# Patient Record
Sex: Female | Born: 1960 | Race: Black or African American | Hispanic: No | Marital: Single | Smoking: Current every day smoker
Health system: Southern US, Community
[De-identification: ages and names within clinical notes are randomized; demographics above are authoritative.]

## PROBLEM LIST (undated history)

## (undated) DIAGNOSIS — K7460 Unspecified cirrhosis of liver: Secondary | ICD-10-CM

## (undated) DIAGNOSIS — E119 Type 2 diabetes mellitus without complications: Secondary | ICD-10-CM

## (undated) DIAGNOSIS — J45909 Unspecified asthma, uncomplicated: Secondary | ICD-10-CM

## (undated) DIAGNOSIS — R569 Unspecified convulsions: Secondary | ICD-10-CM

## (undated) DIAGNOSIS — I10 Essential (primary) hypertension: Secondary | ICD-10-CM

## (undated) DIAGNOSIS — E7800 Pure hypercholesterolemia, unspecified: Secondary | ICD-10-CM

## (undated) HISTORY — PX: TUBAL LIGATION: SHX77

## (undated) HISTORY — PX: HERNIA REPAIR: SHX51

---

## 2017-09-03 ENCOUNTER — Emergency Department (HOSPITAL_BASED_OUTPATIENT_CLINIC_OR_DEPARTMENT_OTHER): Payer: Medicaid Other

## 2017-09-03 ENCOUNTER — Other Ambulatory Visit: Payer: Self-pay

## 2017-09-03 ENCOUNTER — Emergency Department (HOSPITAL_BASED_OUTPATIENT_CLINIC_OR_DEPARTMENT_OTHER)
Admission: EM | Admit: 2017-09-03 | Discharge: 2017-09-03 | Disposition: A | Discharge status: Home / Self Care | Payer: Medicaid Other | Attending: Emergency Medicine | Admitting: Emergency Medicine

## 2017-09-03 ENCOUNTER — Encounter (HOSPITAL_BASED_OUTPATIENT_CLINIC_OR_DEPARTMENT_OTHER): Payer: Self-pay | Admitting: *Deleted

## 2017-09-03 DIAGNOSIS — R1032 Left lower quadrant pain: Secondary | ICD-10-CM

## 2017-09-03 DIAGNOSIS — R197 Diarrhea, unspecified: Secondary | ICD-10-CM | POA: Insufficient documentation

## 2017-09-03 DIAGNOSIS — F17210 Nicotine dependence, cigarettes, uncomplicated: Secondary | ICD-10-CM | POA: Diagnosis not present

## 2017-09-03 HISTORY — DX: Pure hypercholesterolemia, unspecified: E78.00

## 2017-09-03 HISTORY — DX: Type 2 diabetes mellitus without complications: E11.9

## 2017-09-03 HISTORY — DX: Unspecified cirrhosis of liver: K74.60

## 2017-09-03 HISTORY — DX: Unspecified asthma, uncomplicated: J45.909

## 2017-09-03 HISTORY — DX: Essential (primary) hypertension: I10

## 2017-09-03 HISTORY — DX: Unspecified convulsions: R56.9

## 2017-09-03 LAB — COMPREHENSIVE METABOLIC PANEL
ALBUMIN: 4.4 g/dL (ref 3.5–5.0)
ALT: 61 U/L — AB (ref 14–54)
AST: 103 U/L — AB (ref 15–41)
Alkaline Phosphatase: 61 U/L (ref 38–126)
Anion gap: 10 (ref 5–15)
BUN: 11 mg/dL (ref 6–20)
CHLORIDE: 104 mmol/L (ref 101–111)
CO2: 25 mmol/L (ref 22–32)
CREATININE: 0.7 mg/dL (ref 0.44–1.00)
Calcium: 9.5 mg/dL (ref 8.9–10.3)
GFR calc Af Amer: >60 mL/min (ref >60)
GLUCOSE: 97 mg/dL (ref 65–99)
Potassium: 3.9 mmol/L (ref 3.5–5.1)
Sodium: 139 mmol/L (ref 135–145)
Total Bilirubin: 0.6 mg/dL (ref 0.3–1.2)
Total Protein: 8.3 g/dL — ABNORMAL HIGH (ref 6.5–8.1)

## 2017-09-03 LAB — URINALYSIS, MICROSCOPIC (REFLEX)

## 2017-09-03 LAB — URINALYSIS, ROUTINE W REFLEX MICROSCOPIC
Bilirubin Urine: NEGATIVE
GLUCOSE, UA: NEGATIVE mg/dL
KETONES UR: NEGATIVE mg/dL
Nitrite: NEGATIVE
PH: 6 (ref 5.0–8.0)
PROTEIN: NEGATIVE mg/dL
Specific Gravity, Urine: 1.015 (ref 1.005–1.030)

## 2017-09-03 LAB — LIPASE, BLOOD: LIPASE: 43 U/L (ref 11–51)

## 2017-09-03 LAB — CBC
HEMATOCRIT: 37.8 % (ref 36.0–46.0)
Hemoglobin: 13 g/dL (ref 12.0–15.0)
MCH: 30.1 pg (ref 26.0–34.0)
MCHC: 34.4 g/dL (ref 30.0–36.0)
MCV: 87.5 fL (ref 78.0–100.0)
PLATELETS: 171 10*3/uL (ref 150–400)
RBC: 4.32 MIL/uL (ref 3.87–5.11)
RDW: 14.2 % (ref 11.5–15.5)
WBC: 3.2 10*3/uL — ABNORMAL LOW (ref 4.0–10.5)

## 2017-09-03 LAB — CBG MONITORING, ED: Glucose-Capillary: 95 mg/dL (ref 65–99)

## 2017-09-03 MED ORDER — DIPHENHYDRAMINE HCL 50 MG/ML IJ SOLN
INTRAMUSCULAR | Status: AC
  Filled 2017-09-03: qty 1

## 2017-09-03 MED ORDER — SODIUM CHLORIDE 0.9 % IV SOLN
40.0000 mg | Freq: Once | INTRAVENOUS | Status: DC
  Filled 2017-09-03: qty 4

## 2017-09-03 MED ORDER — ONDANSETRON HCL 4 MG/2ML IJ SOLN
4.0000 mg | Freq: Once | INTRAMUSCULAR | Status: AC
  Administered 2017-09-03: 4 mg via INTRAVENOUS
  Filled 2017-09-03: qty 2

## 2017-09-03 MED ORDER — LOPERAMIDE HCL 2 MG PO CAPS: 2.0000 mg | ORAL_CAPSULE | Freq: Four times a day (QID) | ORAL | 0 refills | Status: AC | PRN

## 2017-09-03 MED ORDER — PREDNISONE 20 MG PO TABS: 20.0000 mg | ORAL_TABLET | Freq: Every day | ORAL | 0 refills | Status: AC

## 2017-09-03 MED ORDER — METHOCARBAMOL 500 MG PO TABS: 500.0000 mg | ORAL_TABLET | Freq: Three times a day (TID) | ORAL | 0 refills | Status: DC | PRN

## 2017-09-03 MED ORDER — IBUPROFEN 400 MG PO TABS
600.0000 mg | ORAL_TABLET | Freq: Once | ORAL | Status: AC
  Administered 2017-09-03: 600 mg via ORAL
  Filled 2017-09-03: qty 1

## 2017-09-03 MED ORDER — FAMOTIDINE IN NACL 20-0.9 MG/50ML-% IV SOLN
INTRAVENOUS | Status: AC
  Administered 2017-09-03: 40 mg
  Filled 2017-09-03: qty 100

## 2017-09-03 MED ORDER — DICYCLOMINE HCL 20 MG PO TABS: 20.0000 mg | ORAL_TABLET | Freq: Two times a day (BID) | ORAL | 0 refills | Status: AC

## 2017-09-03 MED ORDER — SODIUM CHLORIDE 0.9 % IV BOLUS (SEPSIS)
1000.0000 mL | Freq: Once | INTRAVENOUS | Status: AC
  Administered 2017-09-03: 1000 mL via INTRAVENOUS

## 2017-09-03 MED ORDER — METHYLPREDNISOLONE SODIUM SUCC 125 MG IJ SOLR
125.0000 mg | Freq: Once | INTRAMUSCULAR | Status: AC
  Administered 2017-09-03: 125 mg via INTRAVENOUS
  Filled 2017-09-03: qty 2

## 2017-09-03 MED ORDER — IOPAMIDOL (ISOVUE-300) INJECTION 61%
100.0000 mL | Freq: Once | INTRAVENOUS | Status: AC | PRN
  Administered 2017-09-03: 100 mL via INTRAVENOUS

## 2017-09-03 MED ORDER — ONDANSETRON 4 MG PO TBDP: 4.0000 mg | ORAL_TABLET | Freq: Three times a day (TID) | ORAL | 0 refills | Status: AC | PRN

## 2017-09-03 MED ORDER — DIPHENHYDRAMINE HCL 50 MG/ML IJ SOLN
50.0000 mg | Freq: Once | INTRAMUSCULAR | Status: AC
  Administered 2017-09-03: 50 mg via INTRAVENOUS

## 2017-09-03 MED ORDER — MORPHINE SULFATE (PF) 4 MG/ML IV SOLN
4.0000 mg | Freq: Once | INTRAVENOUS | Status: AC
  Administered 2017-09-03: 4 mg via INTRAVENOUS
  Filled 2017-09-03: qty 1

## 2017-09-03 MED ORDER — IBUPROFEN 800 MG PO TABS: 800.0000 mg | ORAL_TABLET | Freq: Three times a day (TID) | ORAL | 0 refills | Status: DC | PRN

## 2017-09-03 NOTE — ED Notes (Signed)
ED Provider at bedside. 

## 2017-09-03 NOTE — ED Notes (Signed)
MD made aware that pt is reporting itching again -- denies chest pain/sob. Plan of care: administer Pepcid.

## 2017-09-03 NOTE — ED Notes (Signed)
Pt reports she began itching while in CT after receiving IV dye -- reports itching to head and chest. Denies sob, chest pain. MD at bedside for eval.

## 2017-09-03 NOTE — ED Notes (Signed)
Pt able to tolerate Sprite and graham crackers with no n/v.

## 2017-09-03 NOTE — ED Notes (Signed)
Patient transported to CT 

## 2017-09-03 NOTE — ED Notes (Signed)
Pt drinking PO contrast for CT scan. CT will wait on labs to result prior to imaging with iv contrast, per protocol, pt with hx DM

## 2017-09-03 NOTE — Discharge Instructions (Addendum)
We believe your symptoms are caused by either a viral infection or possible a bad food exposure.  Either way, since your symptoms have improved, we feel it is safe for you to go home and follow up with your regular doctor.  Please read the included information and stick to a bland diet for the next two days.  Drink plenty of clear fluids, and if you were provided with a prescription, please take it according to the label instructions.    If you develop any new or worsening symptoms, including persistent vomiting not controlled with medication, fever greater than 101, severe or worsening abdominal pain, or other symptoms that concern you, please return immediately to the Emergency Department.  You had a reaction to the contrast dye in the CT scanner. Take the steroid as directed and Benadryl as needed for itching.   Viral Gastroenteritis  Viral gastroenteritis is also known as stomach flu. This condition affects the stomach and intestinal tract. It can cause sudden diarrhea and vomiting. The illness typically lasts 3 to 8 days. Most people develop an immune response that eventually gets rid of the virus. While this natural response develops, the virus can make you quite ill.  CAUSES  Many different viruses can cause gastroenteritis, such as rotavirus or noroviruses. You can catch one of these viruses by consuming contaminated food or water. You may also catch a virus by sharing utensils or other personal items with an infected person or by touching a contaminated surface.  SYMPTOMS  The most common symptoms are diarrhea and vomiting. These problems can cause a severe loss of body fluids (dehydration) and a body salt (electrolyte) imbalance. Other symptoms may include:  Fever.  Headache.  Fatigue.  Abdominal pain. DIAGNOSIS  Your caregiver can usually diagnose viral gastroenteritis based on your symptoms and a physical exam. A stool sample may also be taken to test for the presence of viruses or other  infections.  TREATMENT  This illness typically goes away on its own. Treatments are aimed at rehydration. The most serious cases of viral gastroenteritis involve vomiting so severely that you are not able to keep fluids down. In these cases, fluids must be given through an intravenous line (IV).  HOME CARE INSTRUCTIONS  Drink enough fluids to keep your urine clear or pale yellow. Drink small amounts of fluids frequently and increase the amounts as tolerated.  Ask your caregiver for specific rehydration instructions.  Avoid:  Foods high in sugar.  Alcohol.  Carbonated drinks.  Tobacco.  Juice.  Caffeine drinks.  Extremely hot or cold fluids.  Fatty, greasy foods.  Too much intake of anything at one time.  Dairy products until 24 to 48 hours after diarrhea stops. You may consume probiotics. Probiotics are active cultures of beneficial bacteria. They may lessen the amount and number of diarrheal stools in adults. Probiotics can be found in yogurt with active cultures and in supplements.  Wash your hands well to avoid spreading the virus.  Only take over-the-counter or prescription medicines for pain, discomfort, or fever as directed by your caregiver. Do not give aspirin to children. Antidiarrheal medicines are not recommended.  Ask your caregiver if you should continue to take your regular prescribed and over-the-counter medicines.  Keep all follow-up appointments as directed by your caregiver. SEEK IMMEDIATE MEDICAL CARE IF:  You are unable to keep fluids down.  You do not urinate at least once every 6 to 8 hours.  You develop shortness of breath.  You notice blood  in your stool or vomit. This may look like coffee grounds.  You have abdominal pain that increases or is concentrated in one small area (localized).  You have persistent vomiting or diarrhea.  You have a fever.  The patient is a child younger than 3 months, and he or she has a fever.  The patient is a child older than 3  months, and he or she has a fever and persistent symptoms.  The patient is a child older than 3 months, and he or she has a fever and symptoms suddenly get worse.  The patient is a baby, and he or she has no tears when crying. MAKE SURE YOU:  Understand these instructions.  Will watch your condition.  Will get help right away if you are not doing well or get worse. This information is not intended to replace advice given to you by your health care provider. Make sure you discuss any questions you have with your health care provider.  Document Released: 08/22/2005 Document Revised: 11/14/2011 Document Reviewed: 06/08/2011  Elsevier Interactive Patient Education [employer]2016 [employer] Inc[employer]

## 2017-09-03 NOTE — ED Notes (Signed)
Pt educated about not driving or performing other critical tasks (such as operating heavy machinery, caring for infant/toddler/child) due to sedative nature of medications received in ED. Also warned about risks of consuming alcohol or taking other medications with sedative properties. Pt/caregiver verbalized understanding.  

## 2017-09-03 NOTE — ED Triage Notes (Signed)
Pt presents with LLQ pain x3days. Denies known fever, n/v. Reports mild diarrhea x3days. States she's been out of all of her medications since moving here approx 6 months ago.

## 2017-09-03 NOTE — ED Provider Notes (Signed)
Emergency Department Provider Note   I have reviewed the triage vital signs and the nursing notes.   HISTORY  Chief Complaint Abdominal Pain   HPI Lori Duke is a 56 y.o. female presents to the emergency department for evaluation of left lower abdominal pain. Pain has been worsening over the last 3 days.  She denies any nausea or vomiting but has had some mild, nonbloody diarrhea..  No dysuria, hesitancy, urgency.  No fevers or chills.  She has been taking over-the-counter medication with no significant relief in symptoms.  Patient reports that she is been out of all of her prescription medications for the last 6 months since moving here and does not have a PCP. Denies any history of diverticulitis or colitis. Has had surgery for hernia repair in the past.    Past Medical History:  Diagnosis Date  . Asthma   . Cirrhosis (HCC)   . Diabetes mellitus without complication (HCC)   . Hypercholesteremia   . Hypertension   . Seizures (HCC)     There are no active problems to display for this patient.   Past Surgical History:  Procedure Laterality Date  . HERNIA REPAIR     x4  . TUBAL LIGATION      Current Outpatient Rx  . Order #: [id number] Class: Historical Med  . Order #: [id number] Class: Print  . Order #: [id number] Class: Print  . Order #: [id number] Class: Print  . Order #: [id number] Class: Print    Allergies Isovue [iopamidol]  No family history on file.  Social History Social History   Tobacco Use  . Smoking status: Current Every Day Smoker  . Smokeless tobacco: Never Used  Substance Use Topics  . Alcohol use: Yes    Comment: daily  . Drug use: No    Review of Systems  Constitutional: No fever/chills Eyes: No visual changes. ENT: No sore throat. Cardiovascular: Denies chest pain. Respiratory: Denies shortness of breath. Gastrointestinal: Positive LLQ abdominal pain.  No nausea, no vomiting. Positive diarrhea.  No constipation. Genitourinary:  Negative for dysuria. Musculoskeletal: Negative for back pain. Skin: Negative for rash. Neurological: Negative for headaches, focal weakness or numbness.  10-point ROS otherwise negative.  ____________________________________________   PHYSICAL EXAM:  VITAL SIGNS: ED Triage Vitals  Enc Vitals Group     BP 09/03/17 0848 (!) 124/92     Pulse Rate 09/03/17 0848 86     Resp 09/03/17 0848 16     Temp 09/03/17 0848 98.3 F (36.8 C)     Temp Source 09/03/17 0848 Oral     SpO2 09/03/17 0848 98 %     Weight 09/03/17 0848 110 lb (49.9 kg)     Height 09/03/17 0848 5\' 4"  (1.626 m)     Pain Score 09/03/17 0847 5   Constitutional: Alert and oriented. Well appearing and in no acute distress. Eyes: Conjunctivae are normal. Head: Atraumatic. Nose: No congestion/rhinnorhea. Mouth/Throat: Mucous membranes are moist.  Oropharynx non-erythematous. Neck: No stridor.  Cardiovascular: Normal rate, regular rhythm. Good peripheral circulation. Grossly normal heart sounds.   Respiratory: Normal respiratory effort.  No retractions. Lungs CTAB. Gastrointestinal: Soft with focal LLQ tenderness to palpation and voluntary guarding. No rebound. No distention.  Musculoskeletal: No lower extremity tenderness nor edema. No gross deformities of extremities. Neurologic:  Normal speech and language. No gross focal neurologic deficits are appreciated.  Skin:  Skin is warm, dry and intact. No rash noted.  ____________________________________________   LABS (all labs ordered are listed, but only abnormal results  are displayed)  Labs Reviewed  COMPREHENSIVE METABOLIC PANEL - Abnormal; Notable for the following components:      Result Value   Total Protein 8.3 (*)    AST 103 (*)    ALT 61 (*)    All other components within normal limits  CBC - Abnormal; Notable for the following components:   WBC 3.2 (*)    All other components within normal limits  URINALYSIS, ROUTINE W REFLEX MICROSCOPIC - Abnormal;  Notable for the following components:   Color, Urine STRAW (*)    Hgb urine dipstick TRACE (*)    Leukocytes, UA TRACE (*)    All other components within normal limits  URINALYSIS, MICROSCOPIC (REFLEX) - Abnormal; Notable for the following components:   Bacteria, UA MANY (*)    Squamous Epithelial / LPF 6-30 (*)    All other components within normal limits  LIPASE, BLOOD  CBG MONITORING, ED   ____________________________________________  RADIOLOGY  CT abdomen pelvis with contrast:  IMPRESSION:  1. No evidence for diverticulitis.  2. There is mild increase caliber of the proximal small bowel loops  without evidence for bowel obstruction. Findings may be secondary to  nonspecific enteritis.  3. Aortic Atherosclerosis (ICD10-I70.0).  4. 5 mm lingular nodule identified. Not imaged on previous exam. No  follow-up needed if patient is low-risk. Non-contrast chest CT can  be considered in 12 months if patient is high-risk. This  recommendation follows the consensus statement: Guidelines for  Management of Incidental Pulmonary Nodules Detected on CT Images:  From the Fleischner Society 2017; Radiology 2017; 284:228-243.      Electronically Signed  By: Signa Kellaylor Stroud M.D.  On: 09/03/2017 10:52    ____________________________________________   PROCEDURES  Procedure(s) performed:   Procedures  None ____________________________________________   INITIAL IMPRESSION / ASSESSMENT AND PLAN / ED COURSE  Pertinent labs & imaging results that were available during my care of the patient were reviewed by me and considered in my medical decision making (see chart for details).  Patient presents to the emergency department for evaluation of left lower quadrant abdominal pain for the last 3 days.  She has focal tenderness to palpation of the left abdomen with some voluntary guarding.  No rebound.  No fevers or chills.  Vital signs are unremarkable.  Plan for labs, fluids, pain  medication, CT to evaluate for diverticulitis.  Patient returned from CT and immediately complaining of total-body itching and some mild dyspnea. No throat tightness. No hypotension or rash. Suspect mild/moderate allergic reaction from contrast dye. Patient given Benadryl IV and Solumedrol once the CT scan was read. Patient was observed in the ED for several hours where itching and allergy symptoms continued to improve. Contrast dye allergy added to patient chart. Patient tolerating PO. Supportive care meds prescribed and information given regarding home to establish PCP to assess and treat Lori Duke health issues.   At this time, I do not feel there is any life-threatening condition present. I have reviewed and discussed all results (EKG, imaging, lab, urine as appropriate), exam findings with patient. I have reviewed nursing notes and appropriate previous records.  I feel the patient is safe to be discharged home without further emergent workup. Discussed usual and customary return precautions. Patient and family (if present) verbalize understanding and are comfortable with this plan.  Patient will follow-up with their primary care provider. If they do not have a primary care provider, information for follow-up has been provided to them. All questions have been answered.  ____________________________________________  FINAL CLINICAL IMPRESSION(S) / ED DIAGNOSES  Final diagnoses:  Left lower quadrant pain  Diarrhea, unspecified type     MEDICATIONS GIVEN DURING THIS VISIT:  Medications  sodium chloride 0.9 % bolus 1,000 mL (0 mLs Intravenous Stopped 09/03/17 1019)  ondansetron (ZOFRAN) injection 4 mg (4 mg Intravenous Given 09/03/17 0912)  morphine 4 MG/ML injection 4 mg (4 mg Intravenous Given 09/03/17 0917)  iopamidol (ISOVUE-300) 61 % injection 100 mL (100 mLs Intravenous Contrast Given 09/03/17 1025)  diphenhydrAMINE (BENADRYL) injection 50 mg (50 mg Intravenous Given 09/03/17 1037)    methylPREDNISolone sodium succinate (SOLU-MEDROL) 125 mg/2 mL injection 125 mg (125 mg Intravenous Given 09/03/17 1128)  famotidine (PEPCID) 20-0.9 MG/50ML-% IVPB (  Stopped 09/03/17 1305)  ibuprofen (ADVIL,MOTRIN) tablet 600 mg (600 mg Oral Given 09/03/17 1226)     Note:  This document was prepared using Dragon voice recognition software and may include unintentional dictation errors.  Alona Bene, MD Emergency Medicine    Catalino Plascencia, Arlyss Repress, MD 09/04/17 [phone]

## 2018-08-12 ENCOUNTER — Other Ambulatory Visit: Payer: Self-pay

## 2018-08-12 ENCOUNTER — Emergency Department (HOSPITAL_BASED_OUTPATIENT_CLINIC_OR_DEPARTMENT_OTHER): Payer: Medicaid Other

## 2018-08-12 ENCOUNTER — Emergency Department (HOSPITAL_BASED_OUTPATIENT_CLINIC_OR_DEPARTMENT_OTHER)
Admission: EM | Admit: 2018-08-12 | Discharge: 2018-08-12 | Disposition: A | Discharge status: Home / Self Care | Payer: Medicaid Other | Attending: Emergency Medicine | Admitting: Emergency Medicine

## 2018-08-12 ENCOUNTER — Encounter (HOSPITAL_BASED_OUTPATIENT_CLINIC_OR_DEPARTMENT_OTHER): Payer: Self-pay | Admitting: Emergency Medicine

## 2018-08-12 DIAGNOSIS — I10 Essential (primary) hypertension: Secondary | ICD-10-CM | POA: Diagnosis not present

## 2018-08-12 DIAGNOSIS — F17210 Nicotine dependence, cigarettes, uncomplicated: Secondary | ICD-10-CM | POA: Insufficient documentation

## 2018-08-12 DIAGNOSIS — E119 Type 2 diabetes mellitus without complications: Secondary | ICD-10-CM | POA: Insufficient documentation

## 2018-08-12 DIAGNOSIS — R05 Cough: Secondary | ICD-10-CM | POA: Diagnosis not present

## 2018-08-12 DIAGNOSIS — J45909 Unspecified asthma, uncomplicated: Secondary | ICD-10-CM | POA: Diagnosis not present

## 2018-08-12 DIAGNOSIS — R21 Rash and other nonspecific skin eruption: Secondary | ICD-10-CM | POA: Insufficient documentation

## 2018-08-12 DIAGNOSIS — Z79899 Other long term (current) drug therapy: Secondary | ICD-10-CM | POA: Insufficient documentation

## 2018-08-12 DIAGNOSIS — R1011 Right upper quadrant pain: Secondary | ICD-10-CM | POA: Insufficient documentation

## 2018-08-12 DIAGNOSIS — K8020 Calculus of gallbladder without cholecystitis without obstruction: Secondary | ICD-10-CM

## 2018-08-12 DIAGNOSIS — R059 Cough, unspecified: Secondary | ICD-10-CM

## 2018-08-12 LAB — CBC WITH DIFFERENTIAL/PLATELET
Abs Immature Granulocytes: 0.02 10*3/uL (ref 0.00–0.07)
Basophils Absolute: 0 10*3/uL (ref 0.0–0.1)
Basophils Relative: 0 %
Eosinophils Absolute: 0 10*3/uL (ref 0.0–0.5)
Eosinophils Relative: 0 %
HCT: 38.8 % (ref 36.0–46.0)
Hemoglobin: 12.4 g/dL (ref 12.0–15.0)
Immature Granulocytes: 0 %
Lymphocytes Relative: 32 %
Lymphs Abs: 1.7 10*3/uL (ref 0.7–4.0)
MCH: 29.5 pg (ref 26.0–34.0)
MCHC: 32 g/dL (ref 30.0–36.0)
MCV: 92.2 fL (ref 80.0–100.0)
Monocytes Absolute: 0.5 10*3/uL (ref 0.1–1.0)
Monocytes Relative: 9 %
Neutro Abs: 3 10*3/uL (ref 1.7–7.7)
Neutrophils Relative %: 59 %
Platelets: 190 10*3/uL (ref 150–400)
RBC: 4.21 MIL/uL (ref 3.87–5.11)
RDW: 14 % (ref 11.5–15.5)
WBC: 5.1 10*3/uL (ref 4.0–10.5)
nRBC: 0 % (ref 0.0–0.2)

## 2018-08-12 LAB — COMPREHENSIVE METABOLIC PANEL
ALT: 18 U/L (ref 0–44)
AST: 36 U/L (ref 15–41)
Albumin: 4 g/dL (ref 3.5–5.0)
Alkaline Phosphatase: 46 U/L (ref 38–126)
Anion gap: 9 (ref 5–15)
BUN: 8 mg/dL (ref 6–20)
CO2: 23 mmol/L (ref 22–32)
Calcium: 9.1 mg/dL (ref 8.9–10.3)
Chloride: 105 mmol/L (ref 98–111)
Creatinine, Ser: 0.62 mg/dL (ref 0.44–1.00)
GFR calc Af Amer: >60 mL/min (ref >60)
GFR calc non Af Amer: >60 mL/min (ref >60)
Glucose, Bld: 106 mg/dL — ABNORMAL HIGH (ref 70–99)
Potassium: 3.3 mmol/L — ABNORMAL LOW (ref 3.5–5.1)
Sodium: 137 mmol/L (ref 135–145)
Total Bilirubin: 0.5 mg/dL (ref 0.3–1.2)
Total Protein: 7.3 g/dL (ref 6.5–8.1)

## 2018-08-12 LAB — LIPASE, BLOOD: Lipase: 30 U/L (ref 11–51)

## 2018-08-12 MED ORDER — KETOROLAC TROMETHAMINE 30 MG/ML IJ SOLN: 15.0000 mg | Freq: Once | INTRAMUSCULAR | Status: DC

## 2018-08-12 MED ORDER — ONDANSETRON HCL 4 MG/2ML IJ SOLN
INTRAMUSCULAR | Status: AC
  Filled 2018-08-12: qty 2

## 2018-08-12 MED ORDER — PREDNISONE 20 MG PO TABS: 60.0000 mg | ORAL_TABLET | Freq: Every day | ORAL | 0 refills | Status: DC

## 2018-08-12 MED ORDER — MORPHINE SULFATE (PF) 4 MG/ML IV SOLN
4.0000 mg | Freq: Once | INTRAVENOUS | Status: DC
  Filled 2018-08-12: qty 1

## 2018-08-12 MED ORDER — AZITHROMYCIN 250 MG PO TABS: 250.0000 mg | ORAL_TABLET | Freq: Every day | ORAL | 0 refills | Status: AC

## 2018-08-12 MED ORDER — ALBUTEROL SULFATE (2.5 MG/3ML) 0.083% IN NEBU
5.0000 mg | INHALATION_SOLUTION | Freq: Once | RESPIRATORY_TRACT | Status: AC
Start: 2018-08-12 — End: 2018-08-12
  Administered 2018-08-12: 5 mg via RESPIRATORY_TRACT
  Filled 2018-08-12: qty 6

## 2018-08-12 MED ORDER — KETOCONAZOLE 2 % EX SHAM: 1.0000 "application " | MEDICATED_SHAMPOO | CUTANEOUS | 0 refills | Status: AC

## 2018-08-12 MED ORDER — ONDANSETRON HCL 4 MG/2ML IJ SOLN
4.0000 mg | Freq: Once | INTRAMUSCULAR | Status: AC
  Administered 2018-08-12: 4 mg via INTRAVENOUS

## 2018-08-12 MED ORDER — MORPHINE SULFATE (PF) 4 MG/ML IV SOLN
4.0000 mg | Freq: Once | INTRAVENOUS | Status: AC
  Administered 2018-08-12: 4 mg via INTRAVENOUS

## 2018-08-12 MED ORDER — ONDANSETRON HCL 4 MG PO TABS: 4.0000 mg | ORAL_TABLET | Freq: Four times a day (QID) | ORAL | 0 refills | Status: AC

## 2018-08-12 MED ORDER — HYDROCODONE-ACETAMINOPHEN 5-325 MG PO TABS: 1.0000 | ORAL_TABLET | Freq: Four times a day (QID) | ORAL | 0 refills | Status: AC | PRN

## 2018-08-12 MED ORDER — ALBUTEROL SULFATE HFA 108 (90 BASE) MCG/ACT IN AERS: 1.0000 | INHALATION_SPRAY | Freq: Four times a day (QID) | RESPIRATORY_TRACT | 0 refills | Status: AC | PRN

## 2018-08-12 NOTE — ED Notes (Signed)
Patient transported to X-ray 

## 2018-08-12 NOTE — Discharge Instructions (Addendum)
You appear to have a tinea fungal infection at your hairline.  Use ketoconazole shampoo 3 times weekly for 2 weeks.  You most likely have an asthma exacerbation.  Use albuterol inhaler every 6 hours as needed for shortness of breath or wheezing.  Take prednisone until completed.  Take azithromycin until completed.  Continue Mucinex as prescribed over-the-counter, as needed for cough.  You can take Norco, 1 to 2 tablets, every 6 hours.  Your CT scan showed possible gallstones and sludge.  This could explain your abdominal pain as well as mid back pain.  The radiologist recommended we assess with a right upper quadrant ultrasound today, however this could also be done by general surgery.  Please follow-up with [facility] for further evaluation.  Please return to emergency department if you develop any new or worsening symptoms including pain lasting more than 6 to 8 hours, intractable vomiting, persistent fever over 100.4, or any other concerning symptoms.  Do not drink alcohol, drive, operate machinery or participate in any other potentially dangerous activities while taking opiate pain medication as it may make you sleepy. Do not take this medication with any other sedating medications, either prescription or over-the-counter. If you were prescribed Percocet or Vicodin, do not take these with acetaminophen (Tylenol) as it is already contained within these medications and overdose of Tylenol is dangerous.   This medication is an opiate (or narcotic) pain medication and can be habit forming.  Use it as little as possible to achieve adequate pain control.  Do not use or use it with extreme caution if you have a history of opiate abuse or dependence. This medication is intended for your use only - do not give any to anyone else and keep it in a secure place where nobody else, especially children, have access to it. It will also cause or worsen constipation, so you may want to consider taking an  over-the-counter stool softener while you are taking this medication.

## 2018-08-12 NOTE — ED Provider Notes (Signed)
[facility] EMERGENCY DEPARTMENT Provider Note   CSN: [id number] Arrival date & time: 08/12/18  1046     History   Chief Complaint Chief Complaint  Patient presents with  . Rash  . Cough    HPI Lori Duke is a 57 y.o. female with history of seizures, hypertension, diabetes, asthma who presents with a 2-week history of cough.  She has had some pain in her back and chest due to coughing.  However, she also fell 1 week ago and has had pain to her low back since.  She also reports she believes she has ringworm on her scalp.  It is itchy.  That has been present for 1 week.  Patient also reports a several month history of right-sided abdominal pain.  It is worse after eating.  She denies any nausea, vomiting, abnormal vaginal bleeding or discharge, urinary symptoms.  HPI  Past Medical History:  Diagnosis Date  . Asthma   . Cirrhosis (HCC)   . Diabetes mellitus without complication (HCC)   . Hypercholesteremia   . Hypertension   . Seizures (HCC)     There are no active problems to display for this patient.   Past Surgical History:  Procedure Laterality Date  . HERNIA REPAIR     x4  . TUBAL LIGATION       OB History   None      Home Medications    Prior to Admission medications   Medication Sig Start Date End Date Taking? Authorizing Provider  albuterol (PROVENTIL HFA;VENTOLIN HFA) 108 (90 Base) MCG/ACT inhaler Inhale 1-2 puffs into the lungs every 6 (six) hours as needed for wheezing or shortness of breath. 08/12/18   Arshdeep Bolger, Waylan Boga, PA-C  azithromycin (ZITHROMAX) 250 MG tablet Take 1 tablet (250 mg total) by mouth daily. Take first 2 tablets together, then 1 every day until finished. 08/12/18   Kashish Yglesias, Waylan Boga, PA-C  dicyclomine (BENTYL) 20 MG tablet Take 1 tablet (20 mg total) by mouth 2 (two) times daily. 09/03/17   Long, Arlyss Repress, MD  HYDROcodone-acetaminophen (NORCO/VICODIN) 5-325 MG tablet Take 1-2 tablets by mouth every 6 (six) hours as needed.  08/12/18   Meri Pelot, Waylan Boga, PA-C  ketoconazole (NIZORAL) 2 % shampoo Apply 1 application topically 3 (three) times a week for 6 doses. 08/13/18 08/25/18  Emi Holes, PA-C  loperamide (IMODIUM) 2 MG capsule Take 1 capsule (2 mg total) by mouth 4 (four) times daily as needed for diarrhea or loose stools. 09/03/17   Long, Arlyss Repress, MD  ondansetron (ZOFRAN ODT) 4 MG disintegrating tablet Take 1 tablet (4 mg total) by mouth every 8 (eight) hours as needed for nausea or vomiting. 09/03/17   Long, Arlyss Repress, MD  ondansetron (ZOFRAN) 4 MG tablet Take 1 tablet (4 mg total) by mouth every 6 (six) hours. 08/12/18   Maygen Sirico, Waylan Boga, PA-C  predniSONE (DELTASONE) 20 MG tablet Take 3 tablets (60 mg total) by mouth daily. 08/12/18   Emi Holes, PA-C  UNKNOWN TO PATIENT     [provider]    Family History No family history on file.  Social History Social History   Tobacco Use  . Smoking status: Current Every Day Smoker  . Smokeless tobacco: Never Used  Substance Use Topics  . Alcohol use: Yes    Comment: daily  . Drug use: No     Allergies   Isovue [iopamidol]   Review of Systems Review of Systems  Constitutional: Negative for  chills and fever.  HENT: Positive for congestion. Negative for facial swelling and sore throat.   Respiratory: Positive for cough. Negative for shortness of breath.   Cardiovascular: Positive for chest pain (with coughing).  Gastrointestinal: Positive for abdominal pain. Negative for nausea and vomiting.  Genitourinary: Negative for dysuria.  Musculoskeletal: Positive for back pain.  Skin: Positive for rash. Negative for wound.  Neurological: Negative for headaches.  Psychiatric/Behavioral: The patient is not nervous/anxious.      Physical Exam Updated Vital Signs BP 137/85 (BP Location: Left Arm)   Pulse 74   Temp 98.6 F (37 C) (Oral)   Resp 20   Ht 5\' 4"  (1.626 m)   Wt 52.2 kg   SpO2 99%   BMI 19.74 kg/m   Physical Exam    Constitutional: She appears well-developed and well-nourished. No distress.  HENT:  Head: Normocephalic and atraumatic.  Mouth/Throat: Oropharynx is clear and moist. No oropharyngeal exudate.  Eyes: Pupils are equal, round, and reactive to light. Conjunctivae are normal. Right eye exhibits no discharge. Left eye exhibits no discharge. No scleral icterus.  Neck: Normal range of motion. Neck supple. No thyromegaly present.  Cardiovascular: Normal rate, regular rhythm, normal heart sounds and intact distal pulses. Exam reveals no gallop and no friction rub.  No murmur heard. Pulmonary/Chest: Effort normal and breath sounds normal. No stridor. No respiratory distress. She has no wheezes. She has no rales.  Abdominal: Soft. Bowel sounds are normal. She exhibits no distension. There is tenderness in the right upper quadrant and right lower quadrant. There is no rebound and no guarding.    Patient jumped off the stretcher with palpation of her right abdomen  Musculoskeletal: She exhibits no edema.  Lymphadenopathy:    She has no cervical adenopathy.  Neurological: She is alert. Coordination normal.  Skin: Skin is warm and dry. No rash noted. She is not diaphoretic. No pallor.  Scaly rash to central hairline with some broken pieces of hair  Psychiatric: She has a normal mood and affect.  Nursing note and vitals reviewed.    ED Treatments / Results  Labs (all labs ordered are listed, but only abnormal results are displayed) Labs Reviewed  COMPREHENSIVE METABOLIC PANEL - Abnormal; Notable for the following components:      Result Value   Potassium 3.3 (*)    Glucose, Bld 106 (*)    All other components within normal limits  CBC WITH DIFFERENTIAL/PLATELET  LIPASE, BLOOD  URINALYSIS, ROUTINE W REFLEX MICROSCOPIC    EKG None  Radiology Ct Abdomen Pelvis Wo Contrast  Result Date: 08/12/2018 CLINICAL DATA:  Right upper quadrant abdominal pain for 1 week with nausea. EXAM: CT ABDOMEN  AND PELVIS WITHOUT CONTRAST TECHNIQUE: Multidetector CT imaging of the abdomen and pelvis was performed following the standard protocol without IV contrast. COMPARISON:  09/03/2017 FINDINGS: Lower chest: Streaky areas of atelectasis but no infiltrates or effusions. The heart is normal in size. No pericardial effusion. The distal esophagus is grossly normal. Hepatobiliary: No focal hepatic lesions without contrast. No intrahepatic biliary dilatation. There is high attenuation material in the gallbladder which could be sludge or stones or both. No CT findings to suggest acute cholecystitis. No common bile duct dilatation. Pancreas: No mass, inflammation or ductal dilatation. Spleen: Normal size.  No focal lesions. Adrenals/Urinary Tract: The adrenal glands and kidneys are unremarkable. No renal, ureteral or bladder calculi or obvious mass without contrast. Stomach/Bowel: The stomach, duodenum, small bowel and colon are grossly normal without oral contrast.  No acute inflammatory changes, mass lesions or obstructive findings. The terminal ileum is normal. The appendix is not identified for certain. Fibrofatty type infiltrative changes involving the right and transverse colon likely due to prior inflammatory bowel disease. Vascular/Lymphatic: The aorta is normal in caliber. Moderate age advanced distal aortic and proximal iliac artery atheroscerlotic calcifications. No mesenteric of retroperitoneal mass or adenopathy. Small scattered lymph nodes are noted. Reproductive: The uterus and ovaries are unremarkable. Other: No pelvic mass or adenopathy. No free pelvic fluid collections. No inguinal mass or adenopathy. No abdominal wall hernia or subcutaneous lesions. Musculoskeletal: No significant bony findings. IMPRESSION: 1. No acute abdominal/pelvic findings, mass lesions or lymphadenopathy. 2. High attenuation material in the gallbladder could reflect gallbladder sludge, stones or both. Ultrasound may be helpful for  further evaluation. I do not see any CT findings to suggest acute cholecystitis. 3. No renal, ureteral or bladder calculi. 4. Fibrofatty type infiltrative changes involving the colon likely due to prior inflammatory bowel disease. 5. Age advanced distal aortic and proximal iliac artery atherosclerotic calcifications. Electronically Signed   By: Rudie Meyer M.D.   On: 08/12/2018 16:24   Dg Chest 2 View  Result Date: 08/12/2018 CLINICAL DATA:  Productive cough and fever for 2 weeks. Asthma. Smoker. EXAM: CHEST - 2 VIEW COMPARISON:  None. FINDINGS: The heart size and mediastinal contours are within normal limits. Both lungs are clear. The visualized skeletal structures are unremarkable. IMPRESSION: No active cardiopulmonary disease. Electronically Signed   By: Myles Rosenthal M.D.   On: 08/12/2018 12:01   Dg Lumbar Spine Complete  Result Date: 08/12/2018 CLINICAL DATA:  Fall 1 week ago with low back pain radiating into both legs. EXAM: LUMBAR SPINE - COMPLETE 4+ VIEW COMPARISON:  None. FINDINGS: No evidence of fracture or subluxation. Bones are osteopenic. No significant disc space narrowing present. Minimal osteophyte formation involving endplates at the L2-3 and L3-4 levels. No bony lesions identified. Calcified plaque is noted in the distal abdominal aorta. IMPRESSION: 1. Osteopenia without acute fracture. 2. No significant degenerative disease with minimal osteophytes at L2-3 and L3-4. 3. Atherosclerosis of the distal abdominal aorta. Electronically Signed   By: Irish Lack M.D.   On: 08/12/2018 14:09    Procedures Procedures (including critical care time)  Medications Ordered in ED Medications  albuterol (PROVENTIL) (2.5 MG/3ML) 0.083% nebulizer solution 5 mg (5 mg Nebulization Given 08/12/18 1114)  morphine 4 MG/ML injection 4 mg (4 mg Intravenous Given 08/12/18 1452)  ondansetron (ZOFRAN) injection 4 mg (4 mg Intravenous Given 08/12/18 1500)     Initial Impression / Assessment and Plan / ED  Course  I have reviewed the triage vital signs and the nursing notes.  Pertinent labs & imaging results that were available during my care of the patient were reviewed by me and considered in my medical decision making (see chart for details).     Patient presenting with multiple complaints.  She presents with cough for 2 weeks, back pain for 1 week, rash on scalp, and abdominal pain for several months. Chest x-ray is clear, however considering asthma with productive sputum, will cover with azithromycin, prednisone burst, albuterol inhaler. Regarding patient's back pain, lumbar x-ray is negative for fracture. However, suspect somewhat related to coughing and biliary colic. Patient very tender on abdominal exam. CT shows hyper attenuation probably stones, sludge, or both. [place] was reccommended. Labs are unremarkable. Patient and family member do not want to wait for [place]. Considering duration of symptoms and labs stable, will refer to  general surgery for outpatient follow up. Will give Norco for pain control. I reviewed the [place] narcotic database and found no discrepancies. Regarding rash, suspect tinea infection. Will treat with ketoconazole shampoo. Follow up to PCP advised, as well as general surgery. Strict return precautions discussed. Patient understands and agrees with plan. Patient vitals stable throughout ED course and discharged in satisfactory condition. I discussed patient case with Dr. Rush Landmark who guided the patient's management and agrees with plan.   Final Clinical Impressions(s) / ED Diagnoses   Final diagnoses:  RUQ pain  Gallbladder calculus  Cough    ED Discharge Orders         Ordered    ketoconazole (NIZORAL) 2 % shampoo  3 times weekly     08/12/18 1652    HYDROcodone-acetaminophen (NORCO/VICODIN) 5-325 MG tablet  Every 6 hours PRN     08/12/18 1648    azithromycin (ZITHROMAX) 250 MG tablet  Daily     08/12/18 1648    predniSONE (DELTASONE) 20 MG tablet  Daily     08/12/18  1648    albuterol (PROVENTIL HFA;VENTOLIN HFA) 108 (90 Base) MCG/ACT inhaler  Every 6 hours PRN     08/12/18 1648    ondansetron (ZOFRAN) 4 MG tablet  Every 6 hours     08/12/18 1648           Albirda Shiel, Waylan Boga, PA-C 08/12/18 2215    Tegeler, Canary Brim, MD 08/13/18 [phone]

## 2018-08-12 NOTE — ED Triage Notes (Signed)
Pt reports she has ringworm on her head. Also c/o cough x 2 weeks. The forceful cough is making her back and chest hurt.

## 2021-10-07 ENCOUNTER — Emergency Department (HOSPITAL_BASED_OUTPATIENT_CLINIC_OR_DEPARTMENT_OTHER): Payer: Medicaid Other

## 2021-10-07 ENCOUNTER — Encounter (HOSPITAL_BASED_OUTPATIENT_CLINIC_OR_DEPARTMENT_OTHER): Payer: Self-pay | Admitting: Emergency Medicine

## 2021-10-07 ENCOUNTER — Other Ambulatory Visit: Payer: Self-pay

## 2021-10-07 ENCOUNTER — Other Ambulatory Visit (HOSPITAL_BASED_OUTPATIENT_CLINIC_OR_DEPARTMENT_OTHER): Payer: Self-pay

## 2021-10-07 ENCOUNTER — Emergency Department (HOSPITAL_BASED_OUTPATIENT_CLINIC_OR_DEPARTMENT_OTHER)
Admission: EM | Admit: 2021-10-07 | Discharge: 2021-10-07 | Disposition: A | Discharge status: Home / Self Care | Payer: Medicaid Other | Attending: Emergency Medicine | Admitting: Emergency Medicine

## 2021-10-07 DIAGNOSIS — Z20822 Contact with and (suspected) exposure to covid-19: Secondary | ICD-10-CM | POA: Insufficient documentation

## 2021-10-07 DIAGNOSIS — J101 Influenza due to other identified influenza virus with other respiratory manifestations: Secondary | ICD-10-CM | POA: Diagnosis not present

## 2021-10-07 DIAGNOSIS — I10 Essential (primary) hypertension: Secondary | ICD-10-CM | POA: Diagnosis not present

## 2021-10-07 DIAGNOSIS — E119 Type 2 diabetes mellitus without complications: Secondary | ICD-10-CM | POA: Insufficient documentation

## 2021-10-07 DIAGNOSIS — R079 Chest pain, unspecified: Secondary | ICD-10-CM

## 2021-10-07 DIAGNOSIS — J45909 Unspecified asthma, uncomplicated: Secondary | ICD-10-CM | POA: Diagnosis not present

## 2021-10-07 DIAGNOSIS — R059 Cough, unspecified: Secondary | ICD-10-CM | POA: Diagnosis present

## 2021-10-07 LAB — CBC WITH DIFFERENTIAL/PLATELET
Abs Immature Granulocytes: 0.01 10*3/uL (ref 0.00–0.07)
Basophils Absolute: 0 10*3/uL (ref 0.0–0.1)
Basophils Relative: 1 %
Eosinophils Absolute: 0 10*3/uL (ref 0.0–0.5)
Eosinophils Relative: 0 %
HCT: 42.4 % (ref 36.0–46.0)
Hemoglobin: 14.6 g/dL (ref 12.0–15.0)
Immature Granulocytes: 0 %
Lymphocytes Relative: 29 %
Lymphs Abs: 1.2 10*3/uL (ref 0.7–4.0)
MCH: 30.6 pg (ref 26.0–34.0)
MCHC: 34.4 g/dL (ref 30.0–36.0)
MCV: 88.9 fL (ref 80.0–100.0)
Monocytes Absolute: 0.4 10*3/uL (ref 0.1–1.0)
Monocytes Relative: 9 %
Neutro Abs: 2.6 10*3/uL (ref 1.7–7.7)
Neutrophils Relative %: 61 %
Platelets: 168 10*3/uL (ref 150–400)
RBC: 4.77 MIL/uL (ref 3.87–5.11)
RDW: 13.3 % (ref 11.5–15.5)
WBC: 4.3 10*3/uL (ref 4.0–10.5)
nRBC: 0 % (ref 0.0–0.2)

## 2021-10-07 LAB — COMPREHENSIVE METABOLIC PANEL
ALT: 38 U/L (ref 0–44)
AST: 90 U/L — ABNORMAL HIGH (ref 15–41)
Albumin: 3.7 g/dL (ref 3.5–5.0)
Alkaline Phosphatase: 48 U/L (ref 38–126)
Anion gap: 13 (ref 5–15)
BUN: 6 mg/dL — ABNORMAL LOW (ref 8–23)
CO2: 28 mmol/L (ref 22–32)
Calcium: 9 mg/dL (ref 8.9–10.3)
Chloride: 93 mmol/L — ABNORMAL LOW (ref 98–111)
Creatinine, Ser: 0.68 mg/dL (ref 0.44–1.00)
GFR, Estimated: >60 mL/min (ref >60)
Glucose, Bld: 119 mg/dL — ABNORMAL HIGH (ref 70–99)
Potassium: 3.7 mmol/L (ref 3.5–5.1)
Sodium: 134 mmol/L — ABNORMAL LOW (ref 135–145)
Total Bilirubin: 0.5 mg/dL (ref 0.3–1.2)
Total Protein: 8.3 g/dL — ABNORMAL HIGH (ref 6.5–8.1)

## 2021-10-07 LAB — RESP PANEL BY RT-PCR (FLU A&B, COVID) ARPGX2
Influenza A by PCR: POSITIVE — AB
Influenza B by PCR: NEGATIVE
SARS Coronavirus 2 by RT PCR: NEGATIVE

## 2021-10-07 MED ORDER — PREDNISONE 10 MG PO TABS
40.0000 mg | ORAL_TABLET | Freq: Every day | ORAL | 0 refills | Status: AC
  Filled 2021-10-07: qty 20, 5d supply, fill #0

## 2021-10-07 MED ORDER — IPRATROPIUM-ALBUTEROL 0.5-2.5 (3) MG/3ML IN SOLN
3.0000 mL | Freq: Once | RESPIRATORY_TRACT | Status: AC
  Administered 2021-10-07: 3 mL via RESPIRATORY_TRACT
  Filled 2021-10-07: qty 3

## 2021-10-07 NOTE — ED Provider Notes (Signed)
[facility] EMERGENCY DEPARTMENT Provider Note   CSN: [id number] Arrival date & time: 10/07/21  1013     History  Chief Complaint  Patient presents with   Chest Pain    Lori Duke is a 61 y.o. female.  61 year old female with history of DM, HTN, seizures, cirrhosis, asthma presents with complaint of cough and congestion x 2 weeks with chills, body aches and SHOB. Home COVID test negative, cough is non productive, no improvement with her albuterol inhaler. Patient is a daily smoker.      Home Medications Prior to Admission medications   Medication Sig Start Date End Date Taking? Authorizing Provider  predniSONE (DELTASONE) 10 MG tablet Take 4 tablets (40 mg total) by mouth daily for 5 days. 10/07/21 10/12/21 Yes Jeannie FendMurphy, Elwanda Moger A, PA-C  albuterol (PROVENTIL HFA;VENTOLIN HFA) 108 (90 Base) MCG/ACT inhaler Inhale 1-2 puffs into the lungs every 6 (six) hours as needed for wheezing or shortness of breath. 08/12/18   Law, Waylan BogaAlexandra M, PA-C  azithromycin (ZITHROMAX) 250 MG tablet Take 1 tablet (250 mg total) by mouth daily. Take first 2 tablets together, then 1 every day until finished. 08/12/18   Law, Waylan BogaAlexandra M, PA-C  dicyclomine (BENTYL) 20 MG tablet Take 1 tablet (20 mg total) by mouth 2 (two) times daily. 09/03/17   Long, Arlyss RepressJoshua G, MD  HYDROcodone-acetaminophen (NORCO/VICODIN) 5-325 MG tablet Take 1-2 tablets by mouth every 6 (six) hours as needed. 08/12/18   Law, Waylan BogaAlexandra M, PA-C  loperamide (IMODIUM) 2 MG capsule Take 1 capsule (2 mg total) by mouth 4 (four) times daily as needed for diarrhea or loose stools. 09/03/17   Long, Arlyss RepressJoshua G, MD  ondansetron (ZOFRAN ODT) 4 MG disintegrating tablet Take 1 tablet (4 mg total) by mouth every 8 (eight) hours as needed for nausea or vomiting. 09/03/17   Long, Arlyss RepressJoshua G, MD  ondansetron (ZOFRAN) 4 MG tablet Take 1 tablet (4 mg total) by mouth every 6 (six) hours. 08/12/18   Emi HolesLaw, Alexandra M, PA-C  UNKNOWN TO PATIENT     [provider]      Allergies    Isovue [iopamidol]    Review of Systems   Review of Systems  Constitutional:  Positive for chills. Negative for fever.  HENT:  Positive for congestion.   Respiratory:  Positive for cough and shortness of breath.   Cardiovascular:  Negative for leg swelling.  Gastrointestinal:  Positive for abdominal pain. Negative for nausea and vomiting.  Musculoskeletal:  Positive for arthralgias and myalgias.  Skin:  Negative for rash.  Allergic/Immunologic: Negative for immunocompromised state.  Neurological:  Negative for weakness.   Physical Exam Updated Vital Signs BP (!) 143/94    Pulse (!) 103    Temp 98.9 F (37.2 C) (Oral)    Resp (!) 28    Ht 5\' 5"  (1.651 m)    Wt 49.9 kg    SpO2 95%    BMI 18.30 kg/m  Physical Exam Vitals and nursing note reviewed.  Constitutional:      General: She is not in acute distress.    Appearance: She is well-developed. She is not diaphoretic.  HENT:     Head: Normocephalic and atraumatic.  Cardiovascular:     Rate and Rhythm: Normal rate and regular rhythm.     Heart sounds: Normal heart sounds.  Pulmonary:     Effort: Pulmonary effort is normal.     Breath sounds: Rhonchi present.  Chest:     Chest  wall: Tenderness present.  Abdominal:     Palpations: Abdomen is soft.     Tenderness: There is generalized abdominal tenderness.  Musculoskeletal:     Right lower leg: No tenderness. No edema.     Left lower leg: No tenderness. No edema.  Skin:    General: Skin is warm and dry.     Findings: No erythema or rash.  Neurological:     Mental Status: She is alert and oriented to person, place, and time.  Psychiatric:        Behavior: Behavior normal.    ED Results / Procedures / Treatments   Labs (all labs ordered are listed, but only abnormal results are displayed) Labs Reviewed  RESP PANEL BY RT-PCR (FLU A&B, COVID) ARPGX2 - Abnormal; Notable for the following components:      Result Value   Influenza A by PCR POSITIVE  (*)    All other components within normal limits  COMPREHENSIVE METABOLIC PANEL - Abnormal; Notable for the following components:   Sodium 134 (*)    Chloride 93 (*)    Glucose, Bld 119 (*)    BUN 6 (*)    Total Protein 8.3 (*)    AST 90 (*)    All other components within normal limits  CBC WITH DIFFERENTIAL/PLATELET    EKG EKG Interpretation  Date/Time:  Thursday October 07 2021 10:23:30 EST Ventricular Rate:  105 PR Interval:  114 QRS Duration: 82 QT Interval:  358 QTC Calculation: 473 R Axis:   72 Text Interpretation: Sinus tachycardia Nonspecific ST abnormality Abnormal ECG When compared with ECG of 12-Aug-2018 11:08, Rate faster Confirmed by Susy Frizzle [phone]) on 10/07/2021 10:55:45 AM  Radiology DG Chest Port 1 View  Result Date: 10/07/2021 CLINICAL DATA:  Chest pain EXAM: PORTABLE CHEST 1 VIEW COMPARISON:  None. FINDINGS: The heart size and mediastinal contours are within normal limits. Mild atherosclerotic calcification of the aortic arch. Hyperinflated lungs without focal consolidation or pleural effusion. The visualized skeletal structures are unremarkable. IMPRESSION: No active disease. Electronically Signed   By: Larose Hires D.O.   On: 10/07/2021 11:20    Procedures Procedures    Medications Ordered in ED Medications  ipratropium-albuterol (DUONEB) 0.5-2.5 (3) MG/3ML nebulizer solution 3 mL (3 mLs Nebulization Given 10/07/21 1113)    ED Course/ Medical Decision Making/ A&P                           Medical Decision Making Amount and/or Complexity of Data Reviewed Labs: ordered. Radiology: ordered.  Risk Prescription drug management.   61 year old female with history of asthma, diabetes, cirrhosis with complaint of shortness of breath, body aches and cough with congestion.  Found to have coarse lung sounds throughout, treated with DuoNeb treatment with improvement.  Labs reviewed, CMP without significant findings, CBC with no white blood cell count and  respiratory panel is positive for influenza A, negative for COVID.  Chest x-ray negative for pneumonia or other acute process. Plan is to discharge on short course of prednisone due to her asthma history although advised to monitor her blood sugar closely.  Recommend recheck with her PCP, return to ED if worsening or concerns.        Final Clinical Impression(s) / ED Diagnoses Final diagnoses:  Influenza A    Rx / DC Orders ED Discharge Orders          Ordered    predniSONE (DELTASONE) 10 MG tablet  Daily  10/07/21 1214              Jeannie Fend, PA-C 10/07/21 1219    Pollyann Savoy, MD 10/09/21 [phone]

## 2021-10-07 NOTE — Discharge Instructions (Signed)
Take Prednisone as prescribed and complete the full course. Recheck with your doctor. Return to the ER for worsening or concerning symptoms.

## 2021-10-07 NOTE — ED Notes (Signed)
To triage for Resp Assessment.  SpO2 98%, +DOE, no current resp meds, BBS decreased with rhonchi RLL, occasional exp wheeze but this cleared after spont cough.

## 2021-10-07 NOTE — ED Triage Notes (Signed)
Pt c/o CP, HA and SHOB/cough  x 2 wks

## 2023-09-10 IMAGING — DX DG CHEST 1V PORT
1 series · 1 of 1 positions shown · non-contrast
Comparison: None.

CLINICAL DATA: Chest pain

EXAM:
PORTABLE CHEST 1 VIEW

[chest ap]
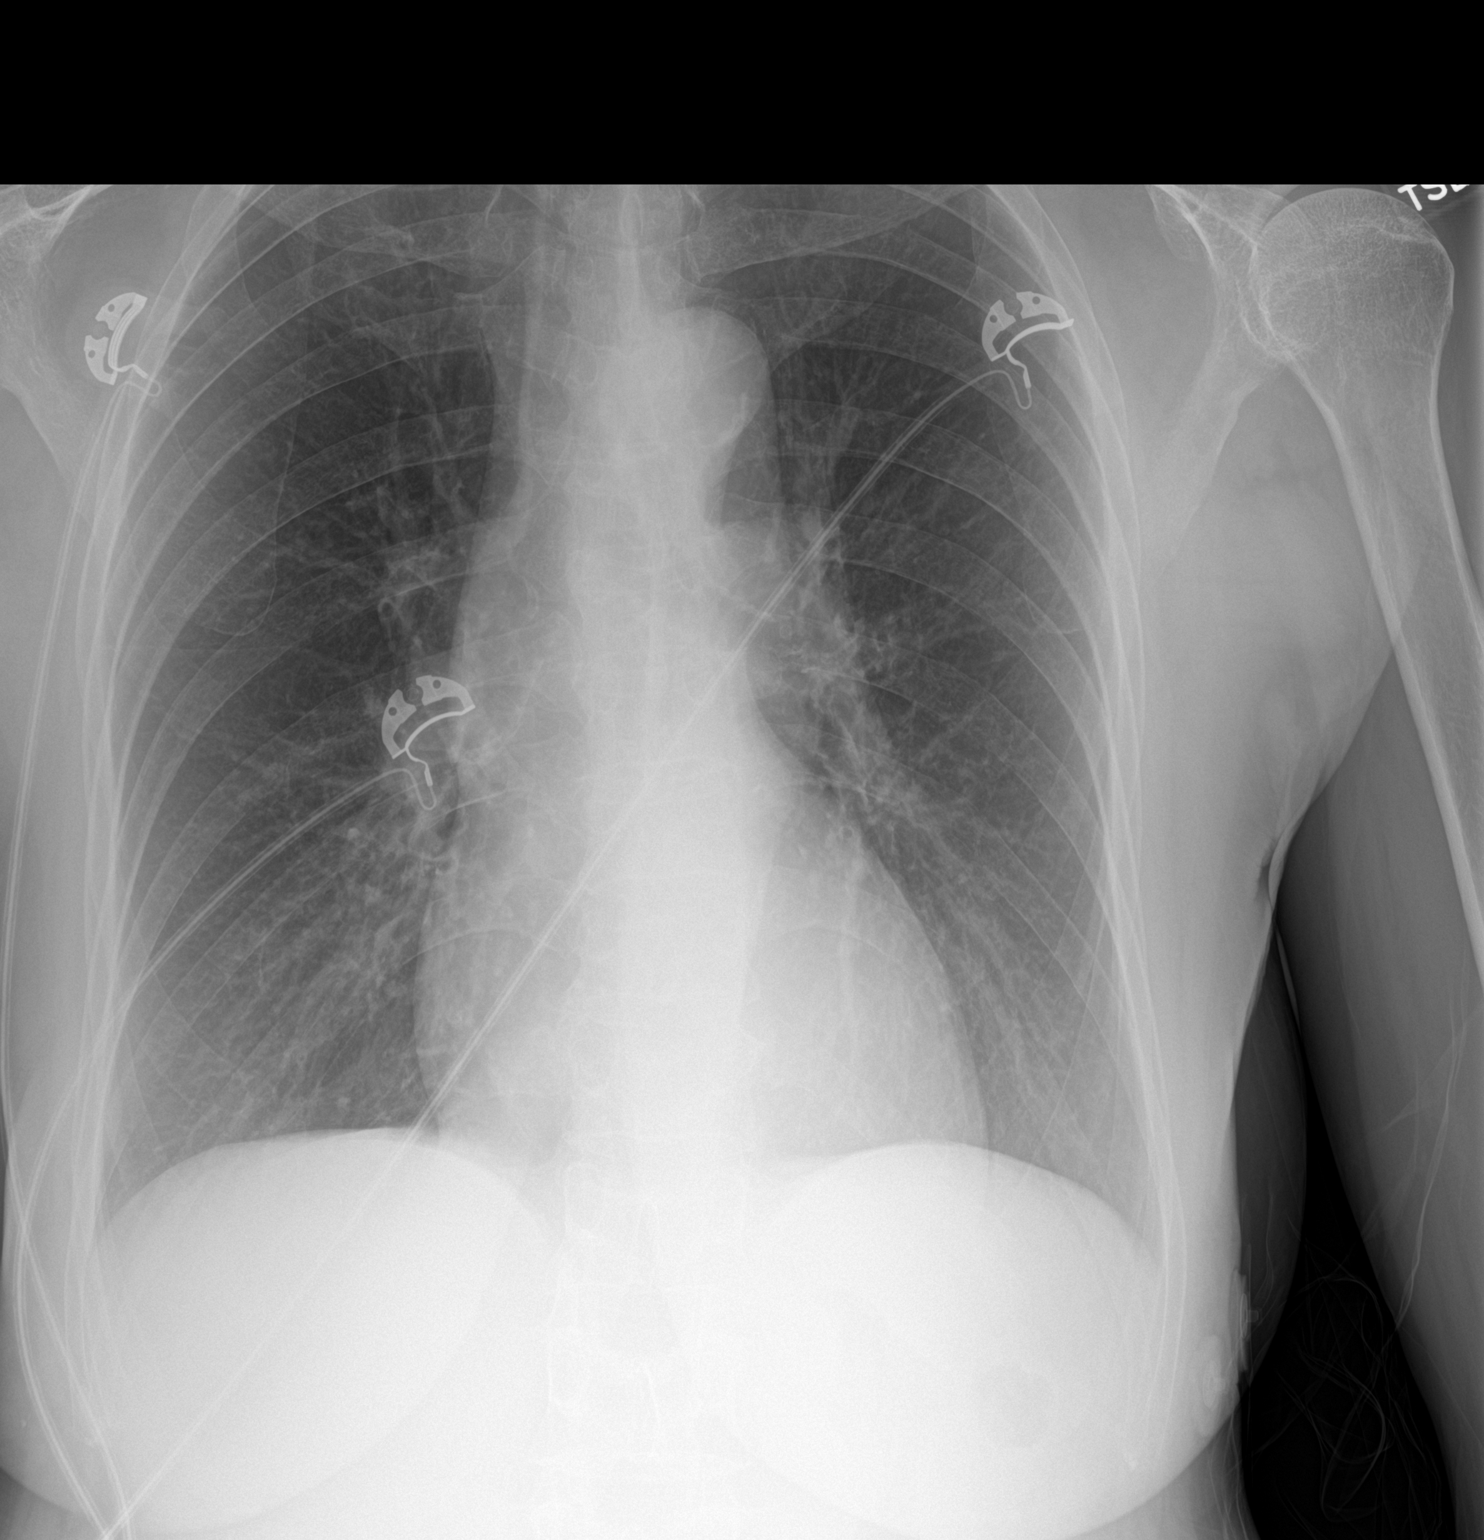

[1 of 1 positions shown; findings below may reference images not displayed]

FINDINGS: The heart size and mediastinal contours are within normal limits.
Mild atherosclerotic calcification of the aortic arch. Hyperinflated
lungs without focal consolidation or pleural effusion. The
visualized skeletal structures are unremarkable.
IMPRESSION: No active disease.
